# Patient Record
Sex: Male | Born: 1970 | Hispanic: No | Marital: Single
Health system: Southern US, Community
[De-identification: ages and names within clinical notes are randomized; demographics above are authoritative.]

---

## 2010-09-11 ENCOUNTER — Emergency Department: Payer: Self-pay | Admitting: Emergency Medicine

## 2010-10-05 ENCOUNTER — Ambulatory Visit: Payer: Self-pay

## 2019-07-06 ENCOUNTER — Ambulatory Visit: Payer: Self-pay | Attending: Internal Medicine

## 2019-07-06 DIAGNOSIS — Z23 Encounter for immunization: Secondary | ICD-10-CM

## 2019-07-06 NOTE — Progress Notes (Signed)
   Covid-19 Vaccination Clinic  Name:  Mario Melton    MRN: [id number] DOB: 04/08/1970  07/06/2019  Mario Melton was observed post Covid-19 immunization for 15 minutes without incident. He was provided with Vaccine Information Sheet and instruction to access the V-Safe system.   Mario Melton was instructed to call 911 with any severe reactions post vaccine: Marland Kitchen Difficulty breathing  . Swelling of face and throat  . A fast heartbeat  . A bad rash all over body  . Dizziness and weakness   Immunizations Administered    Name Date Dose VIS Date Route   Pfizer COVID-19 Vaccine 07/06/2019  2:13 PM 0.3 mL 04/26/2018 Intramuscular   Manufacturer: [employer], [employer]   Lot: [id number]   NDC: [address]-3

## 2019-08-01 ENCOUNTER — Ambulatory Visit: Payer: Self-pay | Attending: Internal Medicine

## 2019-08-01 DIAGNOSIS — Z23 Encounter for immunization: Secondary | ICD-10-CM

## 2019-08-01 NOTE — Progress Notes (Signed)
   Covid-19 Vaccination Clinic  Name:  Odes Lolli    MRN: [id number] DOB: 1970-06-14  08/01/2019  Mr. Harbaugh was observed post Covid-19 immunization for 15 minutes without incident. He was provided with Vaccine Information Sheet and instruction to access the V-Safe system.   Mr. Korver was instructed to call 911 with any severe reactions post vaccine: Marland Kitchen Difficulty breathing  . Swelling of face and throat  . A fast heartbeat  . A bad rash all over body  . Dizziness and weakness   Immunizations Administered    Name Date Dose VIS Date Route   Pfizer COVID-19 Vaccine 08/01/2019  1:45 PM 0.3 mL 04/26/2018 Intramuscular   Manufacturer: [employer], [employer]   Lot: [id number]   NDC: [address]-2

## 2020-07-03 ENCOUNTER — Other Ambulatory Visit: Payer: Self-pay

## 2020-07-03 ENCOUNTER — Encounter: Payer: Self-pay | Admitting: Emergency Medicine

## 2020-07-03 ENCOUNTER — Emergency Department
Admission: EM | Admit: 2020-07-03 | Discharge: 2020-07-03 | Disposition: A | Discharge status: Home / Self Care | Payer: Self-pay | Attending: Student in an Organized Health Care Education/Training Program | Admitting: Student in an Organized Health Care Education/Training Program

## 2020-07-03 ENCOUNTER — Emergency Department: Payer: Self-pay

## 2020-07-03 DIAGNOSIS — L03115 Cellulitis of right lower limb: Secondary | ICD-10-CM

## 2020-07-03 DIAGNOSIS — F17290 Nicotine dependence, other tobacco product, uncomplicated: Secondary | ICD-10-CM | POA: Insufficient documentation

## 2020-07-03 LAB — COMPREHENSIVE METABOLIC PANEL
ALT: 18 U/L (ref 0–44)
AST: 25 U/L (ref 15–41)
Albumin: 3.9 g/dL (ref 3.5–5.0)
Alkaline Phosphatase: 58 U/L (ref 38–126)
Anion gap: 11 (ref 5–15)
BUN: 16 mg/dL (ref 6–20)
CO2: 22 mmol/L (ref 22–32)
Calcium: 9.5 mg/dL (ref 8.9–10.3)
Chloride: 102 mmol/L (ref 98–111)
Creatinine, Ser: 0.93 mg/dL (ref 0.61–1.24)
GFR, Estimated: >60 mL/min (ref >60)
Glucose, Bld: 91 mg/dL (ref 70–99)
Potassium: 4.1 mmol/L (ref 3.5–5.1)
Sodium: 135 mmol/L (ref 135–145)
Total Bilirubin: 1.1 mg/dL (ref 0.3–1.2)
Total Protein: 8 g/dL (ref 6.5–8.1)

## 2020-07-03 LAB — CBC WITH DIFFERENTIAL/PLATELET
Abs Immature Granulocytes: 0.04 10*3/uL (ref 0.00–0.07)
Basophils Absolute: 0.1 10*3/uL (ref 0.0–0.1)
Basophils Relative: 1 %
Eosinophils Absolute: 0.1 10*3/uL (ref 0.0–0.5)
Eosinophils Relative: 2 %
HCT: 46 % (ref 39.0–52.0)
Hemoglobin: 15.5 g/dL (ref 13.0–17.0)
Immature Granulocytes: 0 %
Lymphocytes Relative: 27 %
Lymphs Abs: 2.6 10*3/uL (ref 0.7–4.0)
MCH: 31.8 pg (ref 26.0–34.0)
MCHC: 33.7 g/dL (ref 30.0–36.0)
MCV: 94.5 fL (ref 80.0–100.0)
Monocytes Absolute: 1 10*3/uL (ref 0.1–1.0)
Monocytes Relative: 10 %
Neutro Abs: 5.8 10*3/uL (ref 1.7–7.7)
Neutrophils Relative %: 60 %
Platelets: 370 10*3/uL (ref 150–400)
RBC: 4.87 MIL/uL (ref 4.22–5.81)
RDW: 12.3 % (ref 11.5–15.5)
WBC: 9.5 10*3/uL (ref 4.0–10.5)
nRBC: 0 % (ref 0.0–0.2)

## 2020-07-03 LAB — PROTIME-INR
INR: 0.9 (ref 0.8–1.2)
Prothrombin Time: 12.6 seconds (ref 11.4–15.2)

## 2020-07-03 MED ORDER — DOXYCYCLINE MONOHYDRATE 100 MG PO TABS: 100.0000 mg | ORAL_TABLET | Freq: Two times a day (BID) | ORAL | 0 refills | Status: AC

## 2020-07-03 MED ORDER — SODIUM CHLORIDE 0.9 % IV SOLN
1.0000 g | Freq: Once | INTRAVENOUS | Status: AC
  Administered 2020-07-03: 1 g via INTRAVENOUS
  Filled 2020-07-03: qty 10

## 2020-07-03 MED ORDER — CEPHALEXIN 500 MG PO CAPS: 500.0000 mg | ORAL_CAPSULE | Freq: Four times a day (QID) | ORAL | 0 refills | Status: AC

## 2020-07-03 NOTE — Discharge Instructions (Addendum)
Take doxycycline twice daily for 7 days. Take Keflex 4 times daily for the next 7 days. Return to the emergency department in 2 days for recheck if symptoms worsen or do not improve.

## 2020-07-03 NOTE — ED Triage Notes (Signed)
C/O right knee injury one week ago.  C/O pain.

## 2020-07-03 NOTE — ED Provider Notes (Signed)
[facility]-EMERGENCY DEPARTMENT  ____________________________________________  Time seen: Approximately 9:24 PM  I have reviewed the triage vital signs and the nursing notes.   HISTORY  Chief Complaint Knee Injury   Historian Patient    HPI Mario Melton is a 50 y.o. male presents to the emergency department with erythema, edema as well as right calf pain.  Patient states that symptoms started a week ago in the absence of trauma.  Patient states that he is a daily smoker but denies recent surgery or prolonged immobilization.  States that he has never had similar symptoms in the past.  No chest pain or chest tightness.  He denies fever or chills at home.   History reviewed. No pertinent past medical history.   Immunizations up to date:  Yes.     History reviewed. No pertinent past medical history.  There are no problems to display for this patient.   History reviewed. No pertinent surgical history.  Prior to Admission medications   Medication Sig Start Date End Date Taking? Authorizing Provider  cephALEXin (KEFLEX) 500 MG capsule Take 1 capsule (500 mg total) by mouth 4 (four) times daily for 7 days. 07/03/20 07/10/20 Yes Pia Mau M, PA-C  doxycycline (ADOXA) 100 MG tablet Take 1 tablet (100 mg total) by mouth 2 (two) times daily for 7 days. 07/03/20 07/10/20 Yes Orvil Feil, PA-C    Allergies Patient has no known allergies.  No family history on file.  Social History     Review of Systems  Constitutional: No fever/chills Eyes:  No discharge ENT: No upper respiratory complaints. Respiratory: no cough. No SOB/ use of accessory muscles to breath Gastrointestinal:   No nausea, no vomiting.  No diarrhea.  No constipation. Musculoskeletal: Negative for musculoskeletal pain. Skin: Patient has right lower extremity edema and erythema.   ____________________________________________   PHYSICAL EXAM:  VITAL SIGNS: ED Triage Vitals  Enc Vitals Group     BP  07/03/20 1812 (!) 164/141     Pulse Rate 07/03/20 1812 93     Resp 07/03/20 1812 18     Temp 07/03/20 1812 98.6 F (37 C)     Temp Source 07/03/20 1812 Oral     SpO2 07/03/20 1812 93 %     Weight 07/03/20 1800 175 lb (79.4 kg)     Height 07/03/20 1800 5\' 7"  (1.702 m)     Head Circumference --      Peak Flow --      Pain Score 07/03/20 1800 6     Pain Loc --      Pain Edu? --      Excl. in GC? --      Constitutional: Alert and oriented. Well appearing and in no acute distress. Eyes: Conjunctivae are normal. PERRL. EOMI. Head: Atraumatic. ENT:      Nose: No congestion/rhinnorhea.      Mouth/Throat: Mucous membranes are moist.  Neck: No stridor.  No cervical spine tenderness to palpation. Cardiovascular: Normal rate, regular rhythm. Normal S1 and S2.  Good peripheral circulation. Respiratory: Normal respiratory effort without tachypnea or retractions. Lungs CTAB. Good air entry to the bases with no decreased or absent breath sounds Gastrointestinal: Bowel sounds x 4 quadrants. Soft and nontender to palpation. No guarding or rigidity. No distention. Musculoskeletal: Full range of motion to all extremities. No obvious deformities noted. Patient has erythema and edema of the right calf. Palpable dorsalis pedis pulse, right.  Patient has 2+ pitting edema on the right. Neurologic:  Normal for  age. No gross focal neurologic deficits are appreciated.  Skin:  Skin is warm, dry and intact. No rash noted. Psychiatric: Mood and affect are normal for age. Speech and behavior are normal.   ____________________________________________   LABS (all labs ordered are listed, but only abnormal results are displayed)  Labs Reviewed  CBC WITH DIFFERENTIAL/PLATELET  COMPREHENSIVE METABOLIC PANEL  PROTIME-INR   ____________________________________________  EKG   ____________________________________________  RADIOLOGY Geraldo Pitter, personally viewed and evaluated these images (plain  radiographs) as part of my medical decision making, as well as reviewing the written report by the radiologist.  US Venous Img Lower Unilateral Right  Result Date: 07/03/2020 CLINICAL DATA:  Right lower extremity pain and swelling for 1 week, injury EXAM: RIGHT LOWER EXTREMITY VENOUS DOPPLER ULTRASOUND TECHNIQUE: Gray-scale sonography with compression, as well as color and duplex ultrasound, were performed to evaluate the deep venous system(s) from the level of the common femoral vein through the popliteal and proximal calf veins. COMPARISON:  None. FINDINGS: VENOUS Normal compressibility of the common femoral, superficial femoral, and popliteal veins, as well as the visualized calf veins. Visualized portions of profunda femoral vein and great saphenous vein unremarkable. No filling defects to suggest DVT on grayscale or color Doppler imaging. Doppler waveforms show normal direction of venous flow, normal respiratory plasticity and response to augmentation. Limited views of the contralateral common femoral vein are unremarkable. OTHER None. Limitations: none IMPRESSION: 1. No evidence of deep venous thrombosis within the right lower extremity. Electronically Signed   By: Sharlet Salina M.D.   On: 07/03/2020 21:59   DG Knee Complete 4 Views Right  Result Date: 07/03/2020 CLINICAL DATA:  Recent fall with right knee pain, initial encounter EXAM: RIGHT KNEE - COMPLETE 4+ VIEW COMPARISON:  None. FINDINGS: No acute fracture or dislocation is noted. Mild medial joint space narrowing is noted. Minimal joint effusion is seen. IMPRESSION: Mild medial joint space narrowing with small joint effusion. Electronically Signed   By: Alcide Clever M.D.   On: 07/03/2020 18:40    ____________________________________________    PROCEDURES  Procedure(s) performed:     Procedures     Medications  cefTRIAXone (ROCEPHIN) 1 g in sodium chloride 0.9 % 100 mL IVPB (1 g Intravenous New Bag/Given 07/03/20 2248)      ____________________________________________   INITIAL IMPRESSION / ASSESSMENT AND PLAN / ED COURSE  Pertinent labs & imaging results that were available during my care of the patient were reviewed by me and considered in my medical decision making (see chart for details).      Assessment and Plan: Right calf pain: 50 year old male presents to the emergency department with erythema and edema of the right calf for the past week.  Patient was hypertensive at triage but vital signs were otherwise reassuring.  On exam, calf was erythematous and edematous with 2+ pitting edema.  Patient had a palpable dorsalis pedis pulse and capillary refill was less than 2 seconds on the right.  Differential diagnosis originally included DVT versus cellulitis.   Venous ultrasound shows no signs of DVT.  CBC and CMP were reassuring.  We will treat for cellulitis with Rocephin and will discharge patient home with doxycycline and Keflex.  Patient was advised to return to the emergency department in 2 days if symptoms do not seem to be improving or if they worsen.  He voiced understanding has easy access to the emergency department.     ____________________________________________  FINAL CLINICAL IMPRESSION(S) / ED DIAGNOSES  Final diagnoses:  Cellulitis of right lower extremity      NEW MEDICATIONS STARTED DURING THIS VISIT:  ED Discharge Orders         Ordered    cephALEXin (KEFLEX) 500 MG capsule  4 times daily        07/03/20 2246    doxycycline (ADOXA) 100 MG tablet  2 times daily        07/03/20 2246              This chart was dictated using voice recognition software/Dragon. Despite best efforts to proofread, errors can occur which can change the meaning. Any change was purely unintentional.     Orvil Feil, PA-C 07/03/20 2251    Willy Eddy, MD 07/03/20 2340

## 2022-06-21 IMAGING — US US EXTREM LOW VENOUS*R*
1 series · 14 of 24 positions shown · non-contrast
Comparison: None.

CLINICAL DATA: Right lower extremity pain and swelling for 1 week,
injury

EXAM:
RIGHT LOWER EXTREMITY VENOUS DOPPLER ULTRASOUND
TECHNIQUE: Gray-scale sonography with compression, as well as color and duplex
ultrasound, were performed to evaluate the deep venous system(s)
from the level of the common femoral vein through the popliteal and
proximal calf veins.

[Series 1: us venous img lower uni right (dvt) · portal-venous · 14 of 33 slices shown]
[im 1/33]
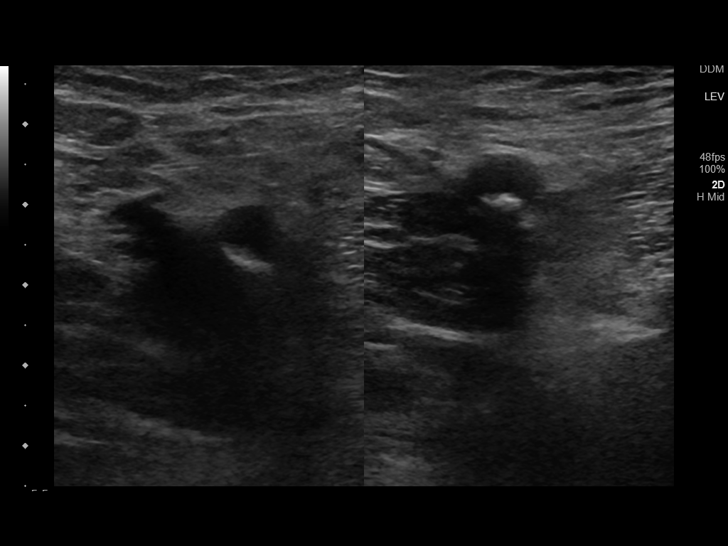
[im 3/33]
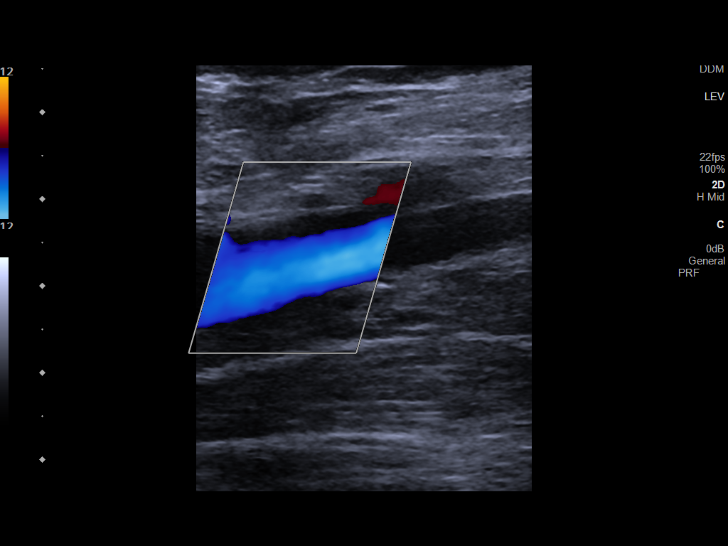
[im 6/33]
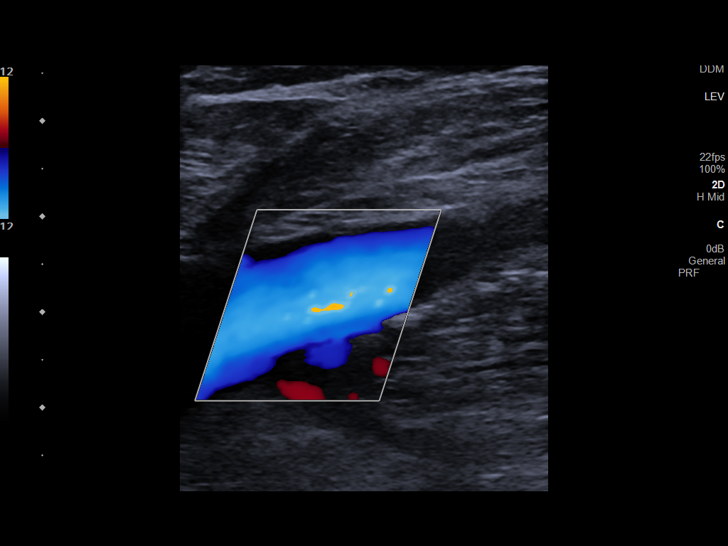
[im 9/33]
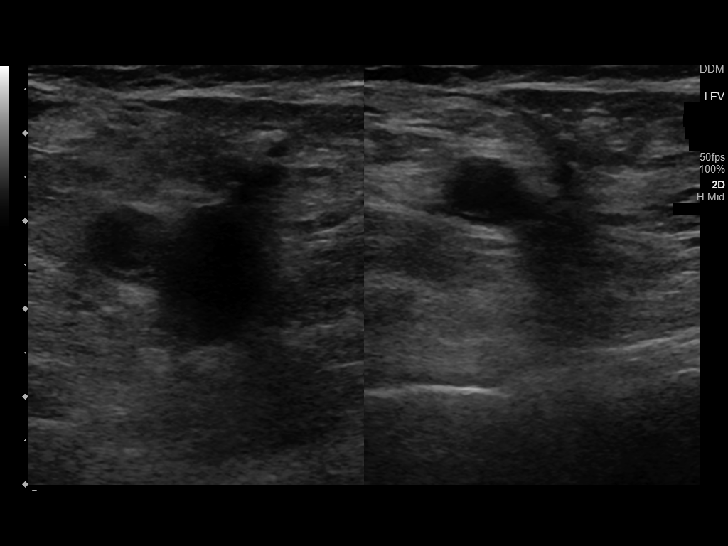
[im 10/33]
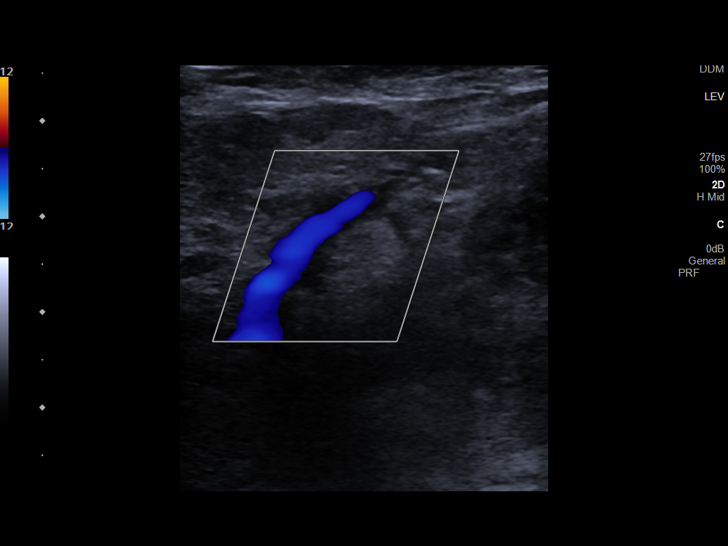
[im 13/33]
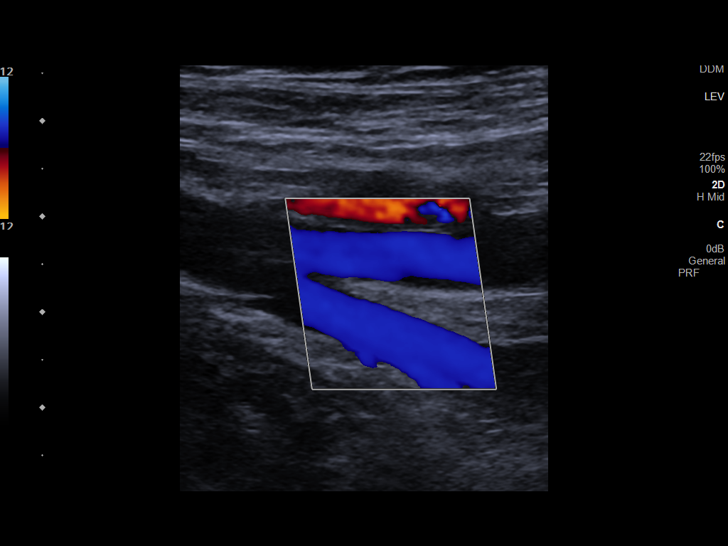
[im 16/33]
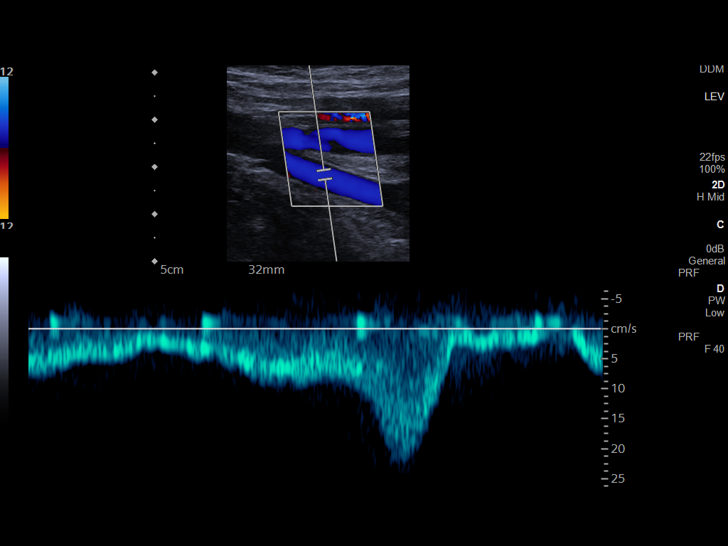
[im 17/33]
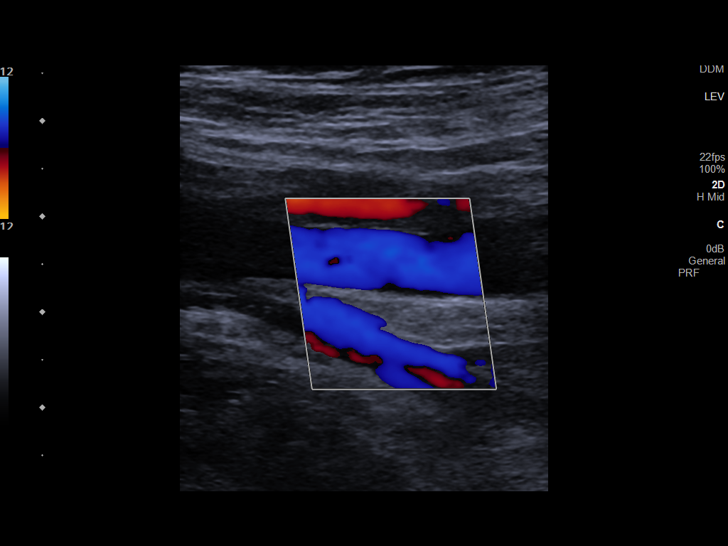
[im 20/33]
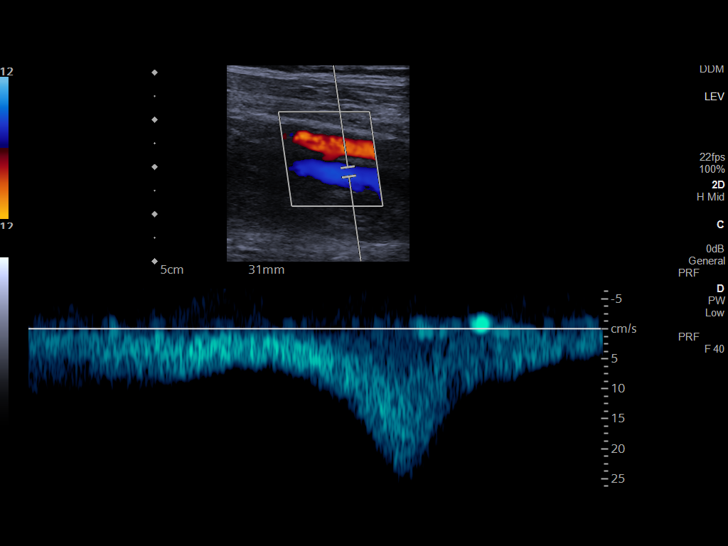
[im 23/33]
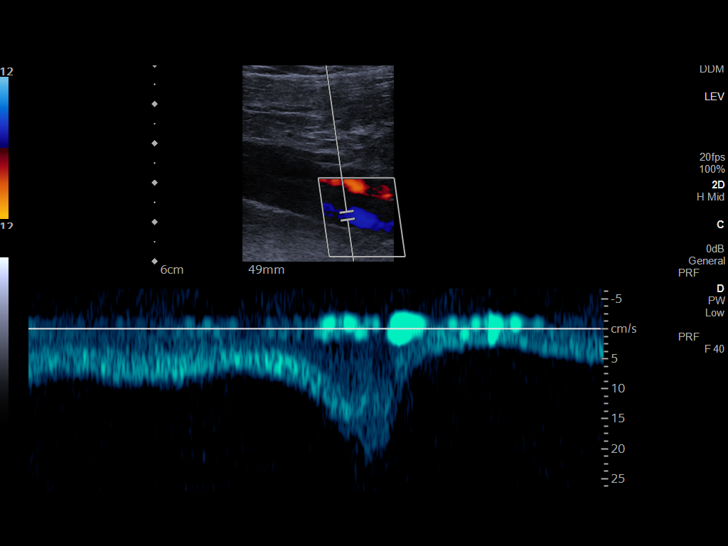
[im 26/33]
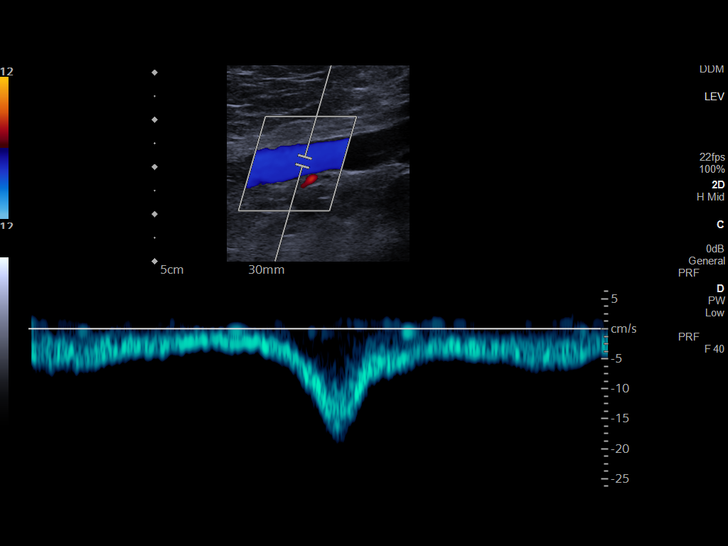
[im 27/33]
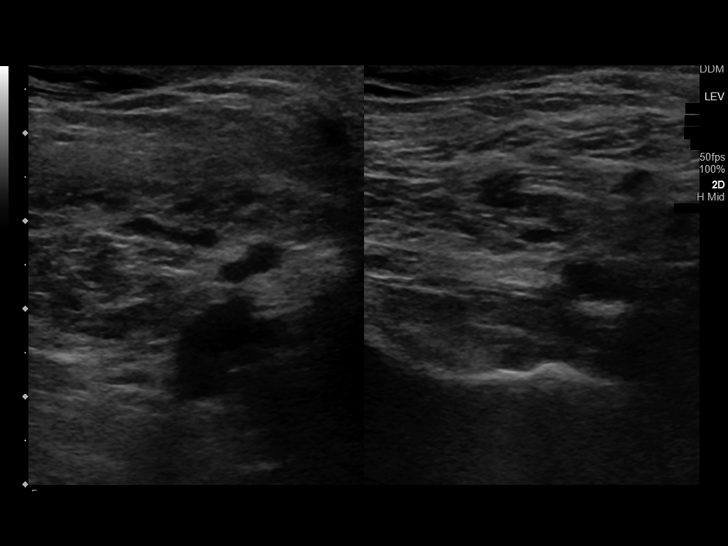
[im 30/33]
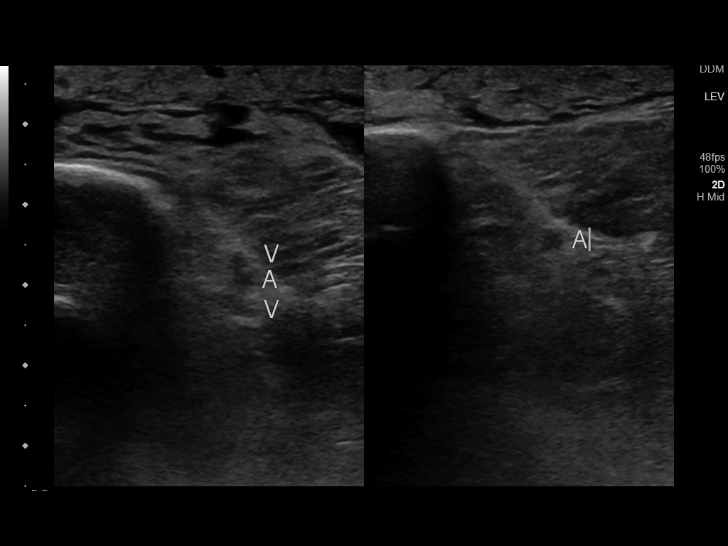
[im 33/33]
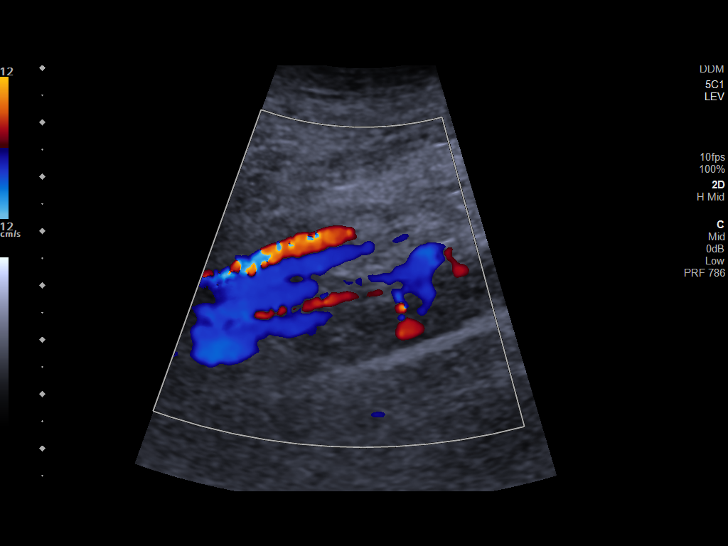

[14 of 24 positions shown; findings below may reference images not displayed]

FINDINGS: VENOUS

Normal compressibility of the common femoral, superficial femoral,
and popliteal veins, as well as the visualized calf veins.
Visualized portions of profunda femoral vein and great saphenous
vein unremarkable. No filling defects to suggest DVT on grayscale or
color Doppler imaging. Doppler waveforms show normal direction of
venous flow, normal respiratory plasticity and response to
augmentation.

Limited views of the contralateral common femoral vein are
unremarkable.

OTHER

None.

Limitations: none
IMPRESSION: 1. No evidence of deep venous thrombosis within the right lower
extremity.
